# Patient Record
Sex: Male | Born: 1970 | Race: Black or African American | Hispanic: No | Marital: Married | Smoking: Current every day smoker
Health system: Southern US, Community
[De-identification: ages and names within clinical notes are randomized; demographics above are authoritative.]

## PROBLEM LIST (undated history)

## (undated) DIAGNOSIS — I10 Essential (primary) hypertension: Secondary | ICD-10-CM

---

## 2018-08-01 ENCOUNTER — Encounter: Payer: Self-pay | Admitting: Emergency Medicine

## 2018-08-01 ENCOUNTER — Other Ambulatory Visit: Payer: Self-pay

## 2018-08-01 ENCOUNTER — Emergency Department: Payer: BLUE CROSS/BLUE SHIELD

## 2018-08-01 ENCOUNTER — Emergency Department
Admission: EM | Admit: 2018-08-01 | Discharge: 2018-08-01 | Disposition: A | Discharge status: Home / Self Care | Payer: BLUE CROSS/BLUE SHIELD | Attending: Emergency Medicine | Admitting: Emergency Medicine

## 2018-08-01 DIAGNOSIS — Z79899 Other long term (current) drug therapy: Secondary | ICD-10-CM | POA: Insufficient documentation

## 2018-08-01 DIAGNOSIS — M7541 Impingement syndrome of right shoulder: Secondary | ICD-10-CM | POA: Diagnosis not present

## 2018-08-01 DIAGNOSIS — Y92410 Unspecified street and highway as the place of occurrence of the external cause: Secondary | ICD-10-CM | POA: Insufficient documentation

## 2018-08-01 DIAGNOSIS — Y9389 Activity, other specified: Secondary | ICD-10-CM | POA: Insufficient documentation

## 2018-08-01 DIAGNOSIS — Y999 Unspecified external cause status: Secondary | ICD-10-CM | POA: Insufficient documentation

## 2018-08-01 DIAGNOSIS — I10 Essential (primary) hypertension: Secondary | ICD-10-CM | POA: Insufficient documentation

## 2018-08-01 DIAGNOSIS — F17200 Nicotine dependence, unspecified, uncomplicated: Secondary | ICD-10-CM | POA: Diagnosis not present

## 2018-08-01 DIAGNOSIS — M25511 Pain in right shoulder: Secondary | ICD-10-CM | POA: Diagnosis present

## 2018-08-01 HISTORY — DX: Essential (primary) hypertension: I10

## 2018-08-01 MED ORDER — MELOXICAM 15 MG PO TABS: 15.0000 mg | ORAL_TABLET | Freq: Every day | ORAL | 0 refills | Status: AC
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
## 2018-08-01 NOTE — ED Notes (Signed)
Patient transported to X-ray 

## 2019-12-23 ENCOUNTER — Other Ambulatory Visit: Payer: Self-pay

## 2019-12-23 ENCOUNTER — Emergency Department
Admission: EM | Admit: 2019-12-23 | Discharge: 2019-12-23 | Disposition: A | Discharge status: Home / Self Care | Payer: BLUE CROSS/BLUE SHIELD | Attending: Emergency Medicine | Admitting: Emergency Medicine

## 2019-12-23 ENCOUNTER — Encounter: Payer: Self-pay | Admitting: Emergency Medicine

## 2019-12-23 DIAGNOSIS — F17200 Nicotine dependence, unspecified, uncomplicated: Secondary | ICD-10-CM | POA: Insufficient documentation

## 2019-12-23 DIAGNOSIS — I160 Hypertensive urgency: Secondary | ICD-10-CM | POA: Insufficient documentation

## 2019-12-23 LAB — URINALYSIS, COMPLETE (UACMP) WITH MICROSCOPIC
Bacteria, UA: NONE SEEN
Bilirubin Urine: NEGATIVE
Glucose, UA: NEGATIVE mg/dL
Hgb urine dipstick: NEGATIVE
Ketones, ur: NEGATIVE mg/dL
Leukocytes,Ua: NEGATIVE
Nitrite: NEGATIVE
Protein, ur: NEGATIVE mg/dL
Specific Gravity, Urine: 1.015 (ref 1.005–1.030)
pH: 6 (ref 5.0–8.0)

## 2019-12-23 LAB — T4, FREE: Free T4: 0.88 ng/dL (ref 0.61–1.12)

## 2019-12-23 LAB — CBC
HCT: 44 % (ref 39.0–52.0)
Hemoglobin: 14 g/dL (ref 13.0–17.0)
MCH: 23.1 pg — ABNORMAL LOW (ref 26.0–34.0)
MCHC: 31.8 g/dL (ref 30.0–36.0)
MCV: 72.6 fL — ABNORMAL LOW (ref 80.0–100.0)
Platelets: 375 10*3/uL (ref 150–400)
RBC: 6.06 MIL/uL — ABNORMAL HIGH (ref 4.22–5.81)
RDW: 16.1 % — ABNORMAL HIGH (ref 11.5–15.5)
WBC: 9.2 10*3/uL (ref 4.0–10.5)
nRBC: 0 % (ref 0.0–0.2)

## 2019-12-23 LAB — COMPREHENSIVE METABOLIC PANEL
ALT: 42 U/L (ref 0–44)
AST: 33 U/L (ref 15–41)
Albumin: 4.3 g/dL (ref 3.5–5.0)
Alkaline Phosphatase: 77 U/L (ref 38–126)
Anion gap: 11 (ref 5–15)
BUN: 15 mg/dL (ref 6–20)
CO2: 26 mmol/L (ref 22–32)
Calcium: 8.7 mg/dL — ABNORMAL LOW (ref 8.9–10.3)
Chloride: 110 mmol/L (ref 98–111)
Creatinine, Ser: 0.86 mg/dL (ref 0.61–1.24)
GFR calc Af Amer: >60 mL/min (ref >60)
GFR calc non Af Amer: >60 mL/min (ref >60)
Glucose, Bld: 93 mg/dL (ref 70–99)
Potassium: 3.8 mmol/L (ref 3.5–5.1)
Sodium: 147 mmol/L — ABNORMAL HIGH (ref 135–145)
Total Bilirubin: 0.8 mg/dL (ref 0.3–1.2)
Total Protein: 7.6 g/dL (ref 6.5–8.1)

## 2019-12-23 LAB — TSH: TSH: 0.3 u[IU]/mL — ABNORMAL LOW (ref 0.350–4.500)

## 2019-12-23 LAB — LIPASE, BLOOD: Lipase: 27 U/L (ref 11–51)

## 2019-12-23 MED ORDER — SODIUM CHLORIDE 0.9 % IV SOLN: 3.0000 g | Freq: Once | INTRAVENOUS | Status: DC

## 2019-12-23 MED ORDER — LABETALOL HCL 100 MG PO TABS: 100.0000 mg | ORAL_TABLET | Freq: Two times a day (BID) | ORAL | 0 refills | Status: AC

## 2019-12-23 MED ORDER — LABETALOL HCL 100 MG PO TABS
100.0000 mg | ORAL_TABLET | ORAL | Status: AC
  Administered 2019-12-23: 16:00:00 100 mg via ORAL
  Filled 2019-12-23: qty 1

## 2019-12-23 NOTE — ED Provider Notes (Signed)
[facility] Emergency Department Provider Note   ____________________________________________   First MD Initiated Contact with Patient 12/23/19 1452     (approximate)  I have reviewed the triage vital signs and the nursing notes.   HISTORY  Chief Complaint Fatigue    HPI Carlos Riddle is a 49 y.o. male   here for evaluation of feeling fatigue  Patient reports that throughout the entirety of the day today just felt tired.  Reports he did not sleep well last night.  He checked his blood pressure at home and it was pretty high, he reports that it was sized 180 or 190.  He had a slight very mild headache very early today that is gone away.  Mild nausea.  When he got up this morning he noticed a brief discomfort in his right upper abdomen that is also gone away completely  He took his home blood pressure medicines for which she takes 3 different pills before he came into the ER.  He now reports he feels quite a bit better and he feels really back to normal.  Denies chest pain or trouble breathing.  No fevers or chills.  No known Covid exposure.  No recent illnesses.  Primary doctor is in [facility], he moved to [place] recently.  No numbness or weakness.  Feels well at the moment feeling much better.  Denies history of thyroid disease  Past Medical History:  Diagnosis Date  . Hypertension     There are no problems to display for this patient.   History reviewed. No pertinent surgical history.  Prior to Admission medications   Medication Sig Start Date End Date Taking? Authorizing Provider  atenolol (TENORMIN) 50 MG tablet Take 50 mg by mouth daily.    [provider]  labetalol (NORMODYNE) 100 MG tablet Take 1 tablet (100 mg total) by mouth 2 (two) times daily. 12/23/19   Sharyn Creamer, MD  losartan (COZAAR) 25 MG tablet Take 25 mg by mouth daily.    [provider]  meloxicam (MOBIC) 15 MG tablet Take 1 tablet (15  mg total) by mouth daily. 08/01/18   Cuthriell, Delorise Royals, PA-C    Allergies Promethazine  History reviewed. No pertinent family history.  Social History Social History   Tobacco Use  . Smoking status: Current Every Day Smoker  . Smokeless tobacco: Never Used  Substance Use Topics  . Alcohol use: Yes  . Drug use: Not on file    Review of Systems Constitutional: No fever/chills Eyes: No visual changes. ENT: No sore throat. Cardiovascular: Denies chest pain. Respiratory: Denies shortness of breath. Gastrointestinal: See HPI Genitourinary: Negative for dysuria. Musculoskeletal: Negative for back pain. Skin: Negative for rash. Neurological: Negative for areas of focal weakness or numbness.  Had a very mild headache this morning which is gone away.    ____________________________________________   PHYSICAL EXAM:  VITAL SIGNS: ED Triage Vitals  Enc Vitals Group     BP 12/23/19 1334 (!) 191/117     Pulse Rate 12/23/19 1334 (!) 108     Resp 12/23/19 1334 20     Temp 12/23/19 1334 98.5 F (36.9 C)     Temp Source 12/23/19 1334 Oral     SpO2 12/23/19 1334 95 %     Weight 12/23/19 1336 264 lb (119.7 kg)     Height 12/23/19 1336 6' (1.829 m)     Head Circumference --      Peak Flow --  Pain Score 12/23/19 1336 6     Pain Loc --      Pain Edu? --      Excl. in GC? --     Constitutional: Alert and oriented. Well appearing and in no acute distress.  Sitting up in no distress.  Very pleasant and conversant. Eyes: Conjunctivae are normal. Head: Atraumatic. Nose: No congestion/rhinnorhea. Mouth/Throat: Mucous membranes are moist. Neck: No stridor.  Cardiovascular: Normal rate, regular rhythm. Grossly normal heart sounds.  Good peripheral circulation. Respiratory: Normal respiratory effort.  No retractions. Lungs CTAB. Gastrointestinal: Soft and nontender. No distention. Musculoskeletal: No lower extremity tenderness nor edema. Neurologic:  Normal speech and  language. No gross focal neurologic deficits are appreciated.  Skin:  Skin is warm, dry and intact. No rash noted. Psychiatric: Mood and affect are normal. Speech and behavior are normal.  ____________________________________________   LABS (all labs ordered are listed, but only abnormal results are displayed)  Labs Reviewed  COMPREHENSIVE METABOLIC PANEL - Abnormal; Notable for the following components:      Result Value   Sodium 147 (*)    Calcium 8.7 (*)    All other components within normal limits  CBC - Abnormal; Notable for the following components:   RBC 6.06 (*)    MCV 72.6 (*)    MCH 23.1 (*)    RDW 16.1 (*)    All other components within normal limits  URINALYSIS, COMPLETE (UACMP) WITH MICROSCOPIC - Abnormal; Notable for the following components:   Color, Urine YELLOW (*)    APPearance CLEAR (*)    All other components within normal limits  TSH - Abnormal; Notable for the following components:   TSH 0.300 (*)    All other components within normal limits  LIPASE, BLOOD  T4, FREE   ____________________________________________  EKG  Is reviewed interpreted by me 1400 Heart rate 105 QRS 99 QTc 460 new line sinus tachycardia, minimal nonspecific T wave abnormality.  No evidence of acute ischemia ____________________________________________  RADIOLOGY   ____________________________________________   PROCEDURES  Procedure(s) performed: None  Procedures  Critical Care performed: No  ____________________________________________   INITIAL IMPRESSION / ASSESSMENT AND PLAN / ED COURSE  Pertinent labs & imaging results that were available during my care of the patient were reviewed by me and considered in my medical decision making (see chart for details).   Patient presents for evaluation of feeling fatigued earlier today this is now gone away.  Associated with this he had high blood pressure at home.  Does tell me he suffered from high blood pressure for  some time and is on actually 4 different medications which we reviewed.  Reassuring clinical exam.  Normal neurologic exam.  Reports all symptoms have improved and he feels well at this time.  Took his blood pressure medicine before he came to the ER  Clinical Course as of Dec 22 1544  Mon Dec 23, 2019  1529 157/118 (improved)   [MQ]    Clinical Course User Index [MQ] Sharyn Creamer, MD   Discussed with Dr. Juliann Pares of cardiology regarding the patient's significant diastolic hypertension.  He recommends adding low-dose 100 mg labetalol twice daily in addition to his current medications which include atenolol, Norvasc, hydrochlorothiazide  Patient feeling improved.  His blood pressure remains elevated with significantly elevated MAP, but his symptoms have improved and reports a long history of hypertension.  He has seen improvement notably in his systolic pressures since arrival to the ER as well  Discussed with the  patient return precautions, and he will actually be following up tomorrow with Dr. Clayborn Bigness the clinic, patient agreeable and will schedule an appointment with Dr. Clayborn Bigness for tomorrow  ____________________________________________   FINAL CLINICAL IMPRESSION(S) / ED DIAGNOSES  Final diagnoses:  Hypertensive urgency        Note:  This document was prepared using Dragon voice recognition software and may include unintentional dictation errors       Delman Kitten, MD 12/23/19 1547

## 2019-12-23 NOTE — Discharge Instructions (Addendum)
Please follow-up tomorrow morning with Dr. Juliann Pares, cardiology, at his office. Call at 8AM to schedule your visit for tomorrow.

## 2019-12-23 NOTE — ED Triage Notes (Signed)
Pt here for malaise and "feeling sluggish".  Sx started yesterday. Pt reports his wife took his BP at home and was elevated.  Denies Chest pain.  Has had some right side mid/upper abdominal pain.  No vision changes or headache.  + nausea.  Denies diarrhea or constipation.  Ambulatory. NAD.  Unlabored.  Just took his bp meds prior to arrival.
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
